# Patient Record
Sex: Male | Born: 1965 | Race: Black or African American | Hispanic: No | Marital: Married | Smoking: Never smoker
Health system: Southern US, Community
[De-identification: ages and names within clinical notes are randomized; demographics above are authoritative.]

## PROBLEM LIST (undated history)

## (undated) DIAGNOSIS — M1990 Unspecified osteoarthritis, unspecified site: Secondary | ICD-10-CM

---

## 2016-11-14 ENCOUNTER — Emergency Department
Admission: EM | Admit: 2016-11-14 | Discharge: 2016-11-14 | Disposition: A | Discharge status: Home / Self Care | Payer: BC Managed Care – PPO | Attending: Emergency Medicine | Admitting: Emergency Medicine

## 2016-11-14 ENCOUNTER — Encounter: Payer: Self-pay | Admitting: *Deleted

## 2016-11-14 ENCOUNTER — Emergency Department: Payer: BC Managed Care – PPO

## 2016-11-14 DIAGNOSIS — R509 Fever, unspecified: Secondary | ICD-10-CM | POA: Diagnosis present

## 2016-11-14 DIAGNOSIS — H81391 Other peripheral vertigo, right ear: Secondary | ICD-10-CM | POA: Insufficient documentation

## 2016-11-14 HISTORY — DX: Unspecified osteoarthritis, unspecified site: M19.90

## 2016-11-14 LAB — CBC
HCT: 43.9 % (ref 40.0–52.0)
Hemoglobin: 14.8 g/dL (ref 13.0–18.0)
MCH: 31.7 pg (ref 26.0–34.0)
MCHC: 33.8 g/dL (ref 32.0–36.0)
MCV: 93.9 fL (ref 80.0–100.0)
PLATELETS: 246 10*3/uL (ref 150–440)
RBC: 4.67 MIL/uL (ref 4.40–5.90)
RDW: 12.5 % (ref 11.5–14.5)
WBC: 4.4 10*3/uL (ref 3.8–10.6)

## 2016-11-14 LAB — BASIC METABOLIC PANEL
Anion gap: 6 (ref 5–15)
BUN: 16 mg/dL (ref 6–20)
CALCIUM: 8.7 mg/dL — AB (ref 8.9–10.3)
CHLORIDE: 105 mmol/L (ref 101–111)
CO2: 27 mmol/L (ref 22–32)
CREATININE: 1.37 mg/dL — AB (ref 0.61–1.24)
GFR calc Af Amer: >60 mL/min (ref >60)
GFR calc non Af Amer: 58 mL/min — ABNORMAL LOW (ref >60)
Glucose, Bld: 117 mg/dL — ABNORMAL HIGH (ref 65–99)
Potassium: 3.9 mmol/L (ref 3.5–5.1)
SODIUM: 138 mmol/L (ref 135–145)

## 2016-11-14 MED ORDER — MECLIZINE HCL 25 MG PO TABS
25.0000 mg | ORAL_TABLET | Freq: Once | ORAL | Status: AC
  Administered 2016-11-14: 25 mg via ORAL
  Filled 2016-11-14: qty 1

## 2016-11-14 MED ORDER — MECLIZINE HCL 32 MG PO TABS: 32.0000 mg | ORAL_TABLET | Freq: Three times a day (TID) | ORAL | 0 refills | Status: AC | PRN

## 2016-11-14 NOTE — ED Provider Notes (Signed)
[facility] Emergency Department Provider Note  ____________________________________________   None    (approximate)  I have reviewed the triage vital signs and the nursing notes.   HISTORY  Chief Complaint Dizziness    HPI Arthur Walter is a 51 y.o. male who presents to the emergency department with lightheadedness that began last night when he went to sleep. He feels a general sense of imbalance like his "equilibrium is off". No chest pain or shortness of breath. She's had subjective fever for the past 3 days. Mild sore throat. No double vision or blurred vision. No numbness or weakness. No falling. No difficulty swallowing.   Past Medical History:  Diagnosis Date  . Arthritis     There are no active problems to display for this patient.   History reviewed. No pertinent surgical history.  Prior to Admission medications   Medication Sig Start Date End Date Taking? Authorizing Provider  meclizine (ANTIVERT) 32 MG tablet Take 1 tablet (32 mg total) by mouth 3 (three) times daily as needed. 11/14/16   Merrily BrittleNeil Dollie Mayse, MD    Allergies Patient has no known allergies.  No family history on file.  Social History Social History  Substance Use Topics  . Smoking status: Never Smoker  . Smokeless tobacco: Never Used  . Alcohol use No    Review of Systems Constitutional: No fever/chills Eyes: No visual changes. ENT: No sore throat. Cardiovascular: Denies chest pain. Respiratory: Denies shortness of breath. Gastrointestinal: No abdominal pain.  No nausea, no vomiting.  No diarrhea.  No constipation. Genitourinary: Negative for dysuria. Musculoskeletal: Negative for back pain. Skin: Negative for rash. Neurological: Negative for headaches negative for numbness negative for weakness positive for lightheadedness and vertigo  10-point ROS otherwise negative.  ____________________________________________   PHYSICAL EXAM:  VITAL  SIGNS: ED Triage Vitals  Enc Vitals Group     BP 11/14/16 1024 133/84     Pulse Rate 11/14/16 1024 75     Resp 11/14/16 1024 18     Temp 11/14/16 1024 98.9 F (37.2 C)     Temp Source 11/14/16 1024 Oral     SpO2 11/14/16 1024 97 %     Weight 11/14/16 1025 230 lb (104.3 kg)     Height 11/14/16 1025 5\' 9"  (1.753 m)     Head Circumference --      Peak Flow --      Pain Score 11/14/16 1025 2     Pain Loc --      Pain Edu? --      Excl. in GC? --     Constitutional: Alert and oriented. Well appearing and in no acute distress. Eyes: Conjunctivae are normal. PERRL. EOMI.Right sided nystagmus fatigable Head: Atraumatic. Nose: No congestion/rhinnorhea. Mouth/Throat: Normal tympanic membranes bilaterally Neck: No stridor.   Cardiovascular: Normal rate, regular rhythm. Grossly normal heart sounds.  Good peripheral circulation. Respiratory: Normal respiratory effort.  No retractions. Lungs CTAB. Gastrointestinal: Soft and nontender. No distention. No abdominal bruits. No CVA tenderness. Musculoskeletal: No lower extremity tenderness nor edema.  No joint effusions. Neurologic:  Cranial nerves II through XII intact aside from slight lateral right sided nystagmus No pronator drift 5 out of 5 grips biceps triceps hip flexion and hip extension plantar flexion dorsiflexion Normal finger-nose-finger no dysdiadochokinesis normal tandem walk Skin:  Skin is warm, dry and intact. No rash noted. Psychiatric: Mood and affect are normal. Speech and behavior are normal.  ____________________________________________   LABS (all labs ordered are listed,  but only abnormal results are displayed)  Labs Reviewed  BASIC METABOLIC PANEL - Abnormal; Notable for the following:       Result Value   Glucose, Bld 117 (*)    Creatinine, Ser 1.37 (*)    Calcium 8.7 (*)    GFR calc non Af Amer 58 (*)    All other components within normal limits  CBC   ____________________________________________  EKG  ED  ECG REPORT I, Merrily Brittle, the attending physician, personally viewed and interpreted this ECG.  Date: 11/14/2016 Rate: 70 Rhythm: normal sinus rhythm QRS Axis: normal Intervals: normal ST/T Wave abnormalities: Nonspecific ST changes T-wave inversion inferiorly in leads 3 and aVF with no ST elevation Conduction Disturbances: none Narrative Interpretation: Pacer spikes but QRS complex not immediately following possibly consistent with pacemaker failure _________________________________________  RADIOLOGY   ____________________________________________   PROCEDURES  Procedure(s) performed: no  Procedures  Critical Care performed: no  ____________________________________________   INITIAL IMPRESSION / ASSESSMENT AND PLAN / ED COURSE  Pertinent labs & imaging results that were available during my care of the patient were reviewed by me and considered in my medical decision making (see chart for details).  The patient has slight lateral nystagmus on rightward gaze that is fatigable and improves with time. Otherwise normal neurological exam. ___________________________________________  The patient has no diplopia no dysphasia no dysarthria and no dysmetria. His symptoms are most consistent with peripheral vertigo. He has no central signs. At this point he is medically stable for outpatient management with symptomatic relief and otolaryngology follow-up symptoms persist_   FINAL CLINICAL IMPRESSION(S) / ED DIAGNOSES  Final diagnoses:  Peripheral vertigo involving right ear      NEW MEDICATIONS STARTED DURING THIS VISIT:  Discharge Medication List as of 11/14/2016  1:18 PM    START taking these medications   Details  meclizine (ANTIVERT) 32 MG tablet Take 1 tablet (32 mg total) by mouth 3 (three) times daily as needed., Starting Mon 11/14/2016, Print         Note:  This document was prepared using Dragon voice recognition software and may include unintentional  dictation errors.     Merrily Brittle, MD 11/14/16 2021

## 2016-11-14 NOTE — ED Triage Notes (Signed)
States dizziness with a headache that began yesterday with nausea, states right leg tingling and numbness for months, awake and alert in no acute distress

## 2016-11-14 NOTE — Discharge Instructions (Signed)
Please return to the emergency department for any new or worsening symptoms such as worsening dizziness, she cannot stand, for numbness for weakness, or for any other concerns. The normal course of your illness is to be sick for about 2-3 days. If her symptoms persist please establish care with an ear nose and throat doctor.  Dg Chest Port 1 View  Result Date: 11/14/2016 CLINICAL DATA:  Dizziness and headache with nausea. Right leg numbness and tingling. Weakness. EXAM: PORTABLE CHEST 1 VIEW COMPARISON:  None. FINDINGS: The cardiomediastinal silhouette is within normal limits. The lungs are well inflated and clear. There is no evidence of pleural effusion or pneumothorax. No acute osseous abnormality is identified. IMPRESSION: No active disease. Electronically Signed   By: Sebastian AcheAllen  Grady M.D.   On: 11/14/2016 13:00

## 2018-03-31 IMAGING — DX DG CHEST 1V PORT
1 series · 1 of 1 positions shown · non-contrast
Comparison: None.

CLINICAL DATA: Dizziness and headache with nausea. Right leg
numbness and tingling. Weakness.

EXAM:
PORTABLE CHEST 1 VIEW

[chest ap]
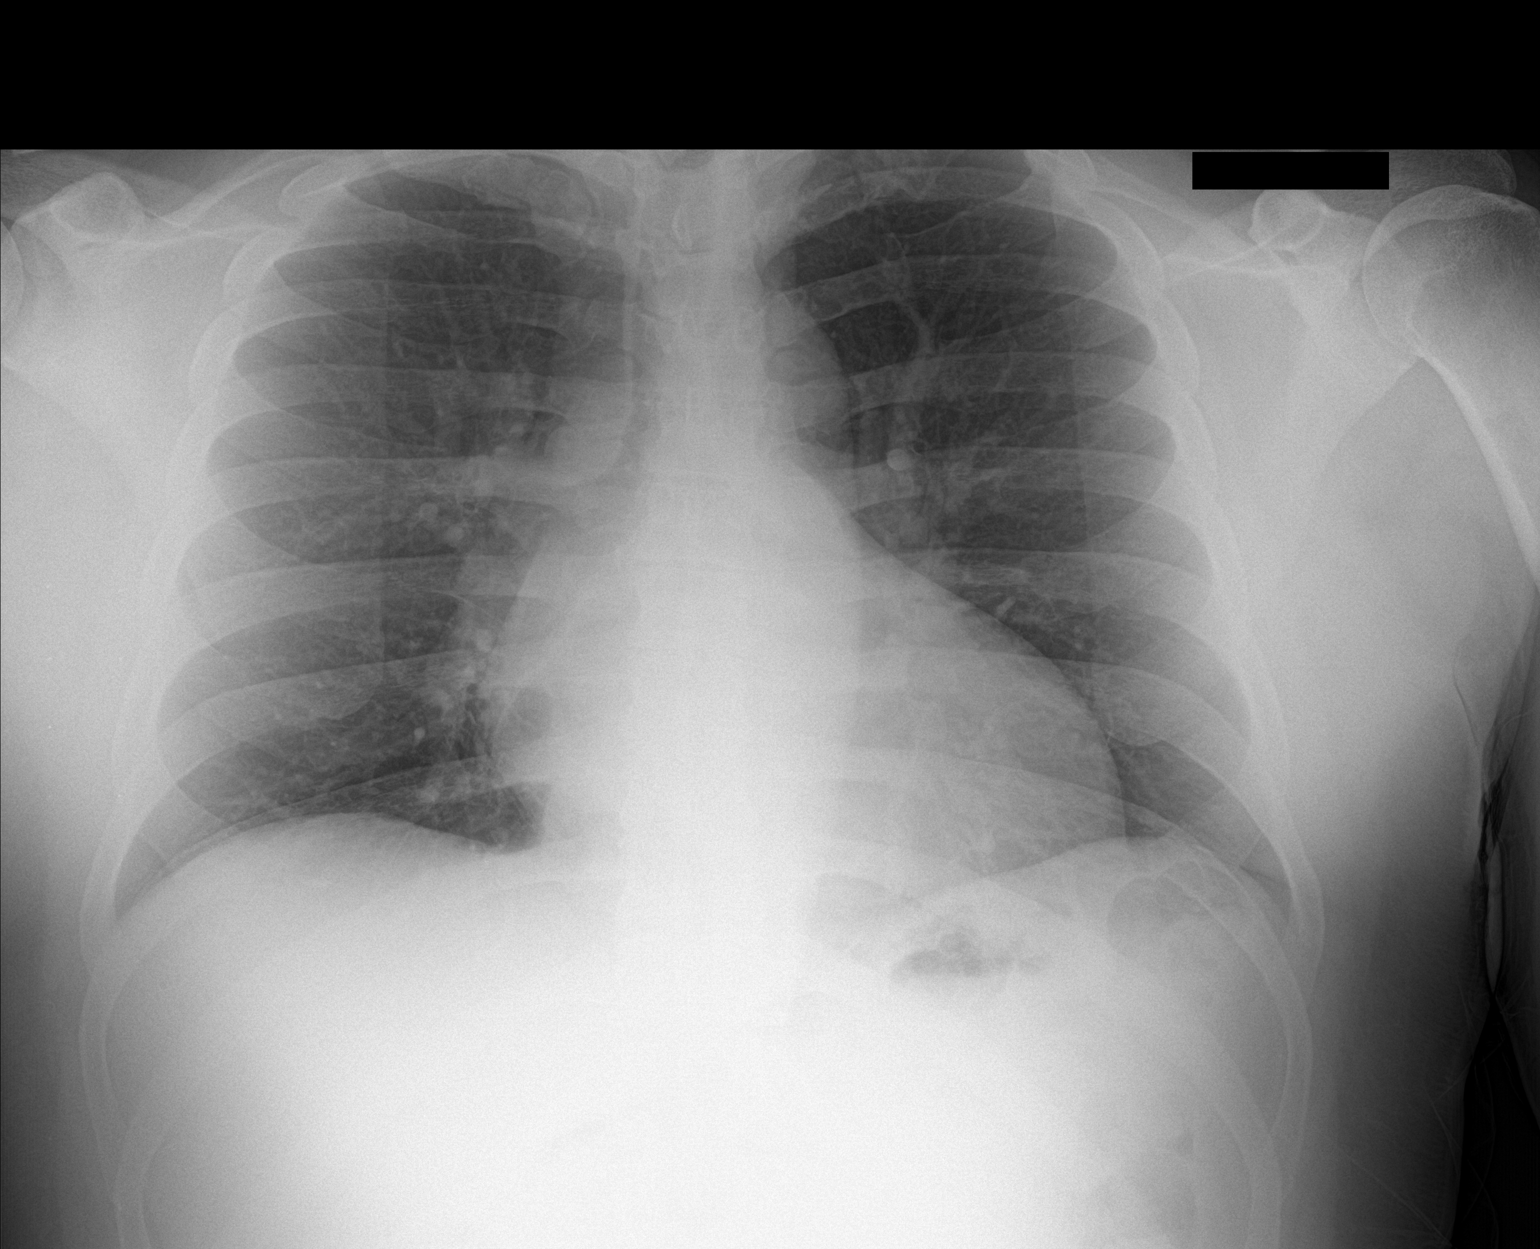

[1 of 1 positions shown; findings below may reference images not displayed]

FINDINGS: The cardiomediastinal silhouette is within normal limits. The lungs
are well inflated and clear. There is no evidence of pleural
effusion or pneumothorax. No acute osseous abnormality is
identified.
IMPRESSION: No active disease.

## 2021-04-05 DIAGNOSIS — I10 Essential (primary) hypertension: Secondary | ICD-10-CM | POA: Insufficient documentation

## 2024-05-13 ENCOUNTER — Institutional Professional Consult (permissible substitution): Admitting: Neurology

## 2024-05-13 ENCOUNTER — Encounter: Payer: Self-pay | Admitting: *Deleted

## 2024-05-13 DIAGNOSIS — R519 Headache, unspecified: Secondary | ICD-10-CM | POA: Insufficient documentation

## 2024-05-13 DIAGNOSIS — E669 Obesity, unspecified: Secondary | ICD-10-CM | POA: Insufficient documentation

## 2024-05-13 DIAGNOSIS — E559 Vitamin D deficiency, unspecified: Secondary | ICD-10-CM | POA: Insufficient documentation

## 2024-05-13 DIAGNOSIS — K2100 Gastro-esophageal reflux disease with esophagitis, without bleeding: Secondary | ICD-10-CM | POA: Insufficient documentation

## 2024-05-13 DIAGNOSIS — M5432 Sciatica, left side: Secondary | ICD-10-CM | POA: Insufficient documentation

## 2024-05-13 DIAGNOSIS — R202 Paresthesia of skin: Secondary | ICD-10-CM | POA: Insufficient documentation

## 2024-05-13 DIAGNOSIS — R45851 Suicidal ideations: Secondary | ICD-10-CM | POA: Insufficient documentation

## 2024-05-13 DIAGNOSIS — K582 Mixed irritable bowel syndrome: Secondary | ICD-10-CM | POA: Insufficient documentation

## 2024-05-13 DIAGNOSIS — R079 Chest pain, unspecified: Secondary | ICD-10-CM | POA: Insufficient documentation

## 2024-05-13 DIAGNOSIS — N529 Male erectile dysfunction, unspecified: Secondary | ICD-10-CM | POA: Insufficient documentation

## 2024-05-13 DIAGNOSIS — H9311 Tinnitus, right ear: Secondary | ICD-10-CM | POA: Insufficient documentation
